# Patient Record
Sex: Male | Born: 1945 | Race: Black or African American | Hispanic: No | Smoking: Never smoker
Health system: Southern US, Community
[De-identification: ages and names within clinical notes are randomized; demographics above are authoritative.]

## PROBLEM LIST (undated history)

## (undated) DIAGNOSIS — Z992 Dependence on renal dialysis: Secondary | ICD-10-CM

## (undated) DIAGNOSIS — I10 Essential (primary) hypertension: Secondary | ICD-10-CM

## (undated) DIAGNOSIS — R7303 Prediabetes: Secondary | ICD-10-CM

## (undated) DIAGNOSIS — N186 End stage renal disease: Secondary | ICD-10-CM

## (undated) DIAGNOSIS — I119 Hypertensive heart disease without heart failure: Secondary | ICD-10-CM

## (undated) DIAGNOSIS — E291 Testicular hypofunction: Secondary | ICD-10-CM

## (undated) HISTORY — DX: Dependence on renal dialysis: Z99.2

## (undated) HISTORY — DX: Testicular hypofunction: E29.1

## (undated) HISTORY — PX: KIDNEY TRANSPLANT: SHX239

## (undated) HISTORY — DX: Essential (primary) hypertension: I10

## (undated) HISTORY — DX: Hypertensive heart disease without heart failure: I11.9

## (undated) HISTORY — DX: Prediabetes: R73.03

## (undated) HISTORY — DX: End stage renal disease: N18.6

---

## 2015-06-19 ENCOUNTER — Ambulatory Visit (INDEPENDENT_AMBULATORY_CARE_PROVIDER_SITE_OTHER): Payer: Medicare Other

## 2015-06-19 ENCOUNTER — Encounter: Payer: Self-pay | Admitting: Cardiovascular Disease

## 2015-06-19 ENCOUNTER — Ambulatory Visit (INDEPENDENT_AMBULATORY_CARE_PROVIDER_SITE_OTHER): Payer: Medicare Other | Admitting: Cardiovascular Disease

## 2015-06-19 VITALS — BP 100/76 | HR 57 | Ht 68.0 in | Wt 183.8 lb

## 2015-06-19 DIAGNOSIS — I4891 Unspecified atrial fibrillation: Secondary | ICD-10-CM | POA: Insufficient documentation

## 2015-06-19 DIAGNOSIS — I480 Paroxysmal atrial fibrillation: Secondary | ICD-10-CM | POA: Diagnosis not present

## 2015-06-19 DIAGNOSIS — I10 Essential (primary) hypertension: Secondary | ICD-10-CM

## 2015-06-19 DIAGNOSIS — N186 End stage renal disease: Secondary | ICD-10-CM | POA: Diagnosis not present

## 2015-06-19 LAB — POCT INR: INR: 1.9

## 2015-06-19 NOTE — Patient Instructions (Signed)

## 2015-06-19 NOTE — Progress Notes (Signed)
Cardiology Office Note   Date:  06/19/2015   ID:  Theodore Harrell, DOB 04-Jan-1946, MRN [id number]  PCP:  No PCP Per Patient  Cardiologist:   Vesta Mixer, MD   Chief Complaint  Patient presents with  . Anticoagulation   Problem List 1. Paroxysmal atrial fib 2. ESRD - on HD  Tu, Th, Sat  3.  HTN - has resolved with dialysis    History of Present Illness: Theodore Harrell is a 70 y.o. male who presents for further evaluation of paroxysmal atrial fib   Theodore Harrell has had end-stage renal disease for years. He is currently on the transplant list. As part of the transplant preoperative evaluation, he had a Lexiscan Myoview study. Apparently he developed paroxysmal atrial fibrillation while getting the [employer]. He was seen by a cardiologist at [facility]. He was started on Coumadin and was referred back to his primary care doctor. His primary care doctor referred him to [place] for further management of his atrial fibrillation and Coumadin /INR levels.  Denies any chest pain or dyspnea.  Works out 3 days a week at [employer]    Past Medical History  Diagnosis Date  . HTN (hypertension)   . Hypertensive heart disease   . End stage renal disease (HCC)   . 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 Prediabetes   . Hypogonadism in male     No past surgical history on file.   Current Outpatient Prescriptions  Medication Sig Dispense Refill  . aspirin EC 81 MG tablet Take 81 mg by mouth daily.    . fluticasone (FLONASE) 50 MCG/ACT nasal spray Place 1 spray into both nostrils daily.  0  . metoprolol succinate (TOPROL-XL) 25 MG 24 hr tablet Take 12.5 mg by mouth daily. TAKE 1/2 TABLET TWICE A DAY    . SENSIPAR 30 MG tablet Take 30 mg by mouth daily.    . sevelamer carbonate (RENVELA) 800 MG tablet Take 800 mg by mouth 3 (three) times daily with meals.    . warfarin (COUMADIN) 5 MG tablet Take 5 mg by mouth daily.     No current facility-administered medications for this visit.     Allergies:   Codeine and Penicillins    Social History:  The patient  reports that he has never smoked. He does not have any smokeless tobacco history on file. He reports that he does not drink alcohol or use illicit drugs.   Family History:  The patient's family history is not on file.    ROS:  Please see the history of present illness.    Review of Systems: Constitutional:  denies fever, chills, diaphoresis, appetite change and fatigue.  HEENT: denies photophobia, eye pain, redness, hearing loss, ear pain, congestion, sore throat, rhinorrhea, sneezing, neck pain, neck stiffness and tinnitus.  Respiratory: denies SOB, DOE, cough, chest tightness, and wheezing.  Cardiovascular: denies chest pain, palpitations and leg swelling.  Gastrointestinal: denies nausea, vomiting, abdominal pain, diarrhea, constipation, blood in stool.  Genitourinary: denies dysuria, urgency, frequency, hematuria, flank pain and difficulty urinating.  Musculoskeletal: denies  myalgias, back pain, joint swelling, arthralgias and gait problem.   Skin: denies pallor, rash and wound.  Neurological: denies dizziness, seizures, syncope, weakness, light-headedness, numbness and headaches.   Hematological: denies adenopathy, easy bruising, personal or family bleeding history.  Psychiatric/ Behavioral: denies suicidal ideation, mood changes, confusion, nervousness, sleep disturbance and agitation.       All other systems are reviewed and negative.  PHYSICAL EXAM: VS:  BP 100/76 mmHg  Pulse 57  Ht  (1.727 m)  Wt 183 lb 12.8 oz (83.371 kg)  BMI 27.95 kg/m2 , BMI Body mass index is 27.95 kg/(m^2). GEN: Well nourished, well developed, in no acute distress HEENT: normal Neck: no JVD, carotid bruits, or masses Cardiac: RRR; no murmurs, rubs, or gallops,no edema  Respiratory:  clear to auscultation bilaterally, normal work of breathing GI: soft, nontender, nondistended, + BS MS: no deformity or  atrophy Skin: warm and dry, no rash Neuro:  Strength and sensation are intact Psych: normal   EKG:  EKG is ordered today. The ekg ordered today demonstrates sinus brady at 57.   Possible LAE.    Recent Labs: No results found for requested labs within last 365 days.    Lipid Panel No results found for: CHOL, TRIG, HDL, CHOLHDL, VLDL, LDLCALC, LDLDIRECT    Wt Readings from Last 3 Encounters:  06/19/15 183 lb 12.8 oz (83.371 kg)      Other studies Reviewed: Additional studies/ records that were reviewed today include: . Review of the above records demonstrates:    ASSESSMENT AND PLAN:  1.  Paroxysmal atrial Fib:  CHADS2VASC = 2 ( age, HTN) ,   He is on coumadin .   Will check INR today .  He is in NSR today   his rate is well-controlled. Continue current dose of metoprolol.  Current medicines are reviewed at length with the patient today.  The patient does not have concerns regarding medicines.  The following changes have been made:  no change  Labs/ tests ordered today include:  No orders of the defined types were placed in this encounter.     Disposition:   FU with me in 1 year.       Les Longmore, Deloris Ping, MD  06/19/2015 2:55 PM    [facility] [address] [place], [place], [place]  [address] Phone: [phone]; Fax: [phone]   [facility]  [address] [place], [place]  [address] [phone]   Fax [phone]

## 2015-06-19 NOTE — Patient Instructions (Addendum)
Medication Instructions:  Your physician recommends that you continue on your current medications as directed. Please refer to the Current Medication list given to you today.   Labwork: TODAY - appointment with [facility] for pt/inr and management   Testing/Procedures: None Ordered   Follow-Up: Management of Coumadin with our Coumadin Clinic  Your physician wants you to follow-up in: 1 year with Dr. Elease Hashimoto.  You will receive a reminder letter in the mail two months in advance. If you don't receive a letter, please call our office to schedule the follow-up appointment.   If you need a refill on your cardiac medications before your next appointment, please call your pharmacy.   Thank you for choosing [facility]! Eligha Bridegroom, RN [phone]

## 2015-07-02 ENCOUNTER — Ambulatory Visit (INDEPENDENT_AMBULATORY_CARE_PROVIDER_SITE_OTHER): Payer: Medicare Other | Admitting: Pharmacist

## 2015-07-02 DIAGNOSIS — I480 Paroxysmal atrial fibrillation: Secondary | ICD-10-CM

## 2015-07-02 LAB — POCT INR: INR: 3

## 2015-07-16 ENCOUNTER — Ambulatory Visit (INDEPENDENT_AMBULATORY_CARE_PROVIDER_SITE_OTHER): Payer: Medicare Other

## 2015-07-16 DIAGNOSIS — I480 Paroxysmal atrial fibrillation: Secondary | ICD-10-CM | POA: Diagnosis not present

## 2015-07-16 LAB — POCT INR: INR: 2.3

## 2015-07-16 MED ORDER — WARFARIN SODIUM 5 MG PO TABS: ORAL_TABLET | ORAL | Status: DC

## 2015-08-06 ENCOUNTER — Ambulatory Visit (INDEPENDENT_AMBULATORY_CARE_PROVIDER_SITE_OTHER): Payer: Medicare Other | Admitting: *Deleted

## 2015-08-06 DIAGNOSIS — I480 Paroxysmal atrial fibrillation: Secondary | ICD-10-CM

## 2015-08-06 LAB — POCT INR: INR: 2

## 2015-10-09 ENCOUNTER — Other Ambulatory Visit: Payer: Self-pay | Admitting: Cardiovascular Disease

## 2015-10-31 ENCOUNTER — Encounter: Payer: Self-pay | Admitting: *Deleted

## 2015-10-31 ENCOUNTER — Ambulatory Visit: Payer: Self-pay | Admitting: Cardiovascular Disease

## 2015-10-31 DIAGNOSIS — I480 Paroxysmal atrial fibrillation: Secondary | ICD-10-CM

## 2015-10-31 NOTE — Telephone Encounter (Signed)
This encounter was created in error - please disregard.

## 2015-11-15 ENCOUNTER — Ambulatory Visit (INDEPENDENT_AMBULATORY_CARE_PROVIDER_SITE_OTHER): Payer: Medicare Other | Admitting: *Deleted

## 2015-11-15 DIAGNOSIS — I480 Paroxysmal atrial fibrillation: Secondary | ICD-10-CM

## 2015-11-15 LAB — POCT INR: INR: 2.2

## 2015-11-22 ENCOUNTER — Ambulatory Visit (INDEPENDENT_AMBULATORY_CARE_PROVIDER_SITE_OTHER): Payer: Medicare Other | Admitting: *Deleted

## 2015-11-22 ENCOUNTER — Encounter (INDEPENDENT_AMBULATORY_CARE_PROVIDER_SITE_OTHER): Payer: Self-pay

## 2015-11-22 DIAGNOSIS — I480 Paroxysmal atrial fibrillation: Secondary | ICD-10-CM

## 2015-11-22 LAB — POCT INR: INR: 2.7

## 2015-12-04 ENCOUNTER — Ambulatory Visit (INDEPENDENT_AMBULATORY_CARE_PROVIDER_SITE_OTHER): Payer: Medicare Other | Admitting: *Deleted

## 2015-12-04 DIAGNOSIS — I480 Paroxysmal atrial fibrillation: Secondary | ICD-10-CM | POA: Diagnosis not present

## 2015-12-04 LAB — POCT INR: INR: 2.5

## 2015-12-11 ENCOUNTER — Ambulatory Visit (INDEPENDENT_AMBULATORY_CARE_PROVIDER_SITE_OTHER): Payer: Medicare Other | Admitting: *Deleted

## 2015-12-11 DIAGNOSIS — I480 Paroxysmal atrial fibrillation: Secondary | ICD-10-CM

## 2015-12-11 LAB — POCT INR: INR: 3.2

## 2015-12-18 ENCOUNTER — Ambulatory Visit (INDEPENDENT_AMBULATORY_CARE_PROVIDER_SITE_OTHER): Payer: Medicare Other | Admitting: *Deleted

## 2015-12-18 DIAGNOSIS — I480 Paroxysmal atrial fibrillation: Secondary | ICD-10-CM | POA: Diagnosis not present

## 2015-12-18 LAB — POCT INR: INR: 3.2

## 2015-12-28 ENCOUNTER — Ambulatory Visit (INDEPENDENT_AMBULATORY_CARE_PROVIDER_SITE_OTHER): Payer: Medicare Other | Admitting: *Deleted

## 2015-12-28 ENCOUNTER — Encounter (INDEPENDENT_AMBULATORY_CARE_PROVIDER_SITE_OTHER): Payer: Self-pay

## 2015-12-28 DIAGNOSIS — I480 Paroxysmal atrial fibrillation: Secondary | ICD-10-CM | POA: Diagnosis not present

## 2015-12-28 LAB — POCT INR: INR: 2.7

## 2016-01-11 ENCOUNTER — Ambulatory Visit (INDEPENDENT_AMBULATORY_CARE_PROVIDER_SITE_OTHER): Payer: Medicare Other | Admitting: *Deleted

## 2016-01-11 DIAGNOSIS — I480 Paroxysmal atrial fibrillation: Secondary | ICD-10-CM

## 2016-01-11 LAB — POCT INR: INR: 3.1

## 2016-01-31 ENCOUNTER — Ambulatory Visit (INDEPENDENT_AMBULATORY_CARE_PROVIDER_SITE_OTHER): Payer: Medicare Other | Admitting: *Deleted

## 2016-01-31 ENCOUNTER — Encounter (INDEPENDENT_AMBULATORY_CARE_PROVIDER_SITE_OTHER): Payer: Self-pay

## 2016-01-31 DIAGNOSIS — I480 Paroxysmal atrial fibrillation: Secondary | ICD-10-CM | POA: Diagnosis not present

## 2016-01-31 LAB — POCT INR: INR: 3.6

## 2016-02-14 ENCOUNTER — Ambulatory Visit (INDEPENDENT_AMBULATORY_CARE_PROVIDER_SITE_OTHER): Payer: Medicare Other | Admitting: *Deleted

## 2016-02-14 DIAGNOSIS — I480 Paroxysmal atrial fibrillation: Secondary | ICD-10-CM | POA: Diagnosis not present

## 2016-02-14 LAB — POCT INR: INR: 2.5

## 2016-03-06 ENCOUNTER — Ambulatory Visit (INDEPENDENT_AMBULATORY_CARE_PROVIDER_SITE_OTHER): Payer: Medicare Other | Admitting: *Deleted

## 2016-03-06 DIAGNOSIS — I480 Paroxysmal atrial fibrillation: Secondary | ICD-10-CM | POA: Diagnosis not present

## 2016-03-06 LAB — POCT INR: INR: 3

## 2016-03-12 ENCOUNTER — Other Ambulatory Visit: Payer: Self-pay | Admitting: Pharmacist

## 2016-03-12 ENCOUNTER — Telehealth: Payer: Self-pay | Admitting: Pharmacist

## 2016-03-12 NOTE — Telephone Encounter (Signed)
Patient called to report medication changes. States that his PCP stopped his ASA 81mg , increased his Crestor to 20mg  daily, and added sildenafil 20mg  - taking 5 tabs prn.  Updated patient's medication list and advised him that none of these medication changes will interact with his warfarin.

## 2016-04-03 ENCOUNTER — Ambulatory Visit (INDEPENDENT_AMBULATORY_CARE_PROVIDER_SITE_OTHER): Payer: Medicare Other | Admitting: *Deleted

## 2016-04-03 DIAGNOSIS — I480 Paroxysmal atrial fibrillation: Secondary | ICD-10-CM | POA: Diagnosis not present

## 2016-04-03 LAB — POCT INR: INR: 4.5

## 2016-04-14 ENCOUNTER — Ambulatory Visit (INDEPENDENT_AMBULATORY_CARE_PROVIDER_SITE_OTHER): Payer: Medicare Other | Admitting: *Deleted

## 2016-04-14 DIAGNOSIS — I480 Paroxysmal atrial fibrillation: Secondary | ICD-10-CM | POA: Diagnosis not present

## 2016-04-14 LAB — POCT INR: INR: 3.7

## 2016-04-28 ENCOUNTER — Ambulatory Visit (INDEPENDENT_AMBULATORY_CARE_PROVIDER_SITE_OTHER): Payer: Medicare Other | Admitting: *Deleted

## 2016-04-28 DIAGNOSIS — I480 Paroxysmal atrial fibrillation: Secondary | ICD-10-CM

## 2016-04-28 LAB — POCT INR: INR: 2.5

## 2016-05-14 ENCOUNTER — Ambulatory Visit (INDEPENDENT_AMBULATORY_CARE_PROVIDER_SITE_OTHER): Payer: Medicare Other | Admitting: *Deleted

## 2016-05-14 DIAGNOSIS — I480 Paroxysmal atrial fibrillation: Secondary | ICD-10-CM | POA: Diagnosis not present

## 2016-05-14 LAB — POCT INR: INR: 2.1

## 2016-06-03 ENCOUNTER — Ambulatory Visit (INDEPENDENT_AMBULATORY_CARE_PROVIDER_SITE_OTHER): Payer: Medicare Other | Admitting: *Deleted

## 2016-06-03 DIAGNOSIS — I480 Paroxysmal atrial fibrillation: Secondary | ICD-10-CM

## 2016-06-03 LAB — POCT INR: INR: 2.3

## 2016-07-02 ENCOUNTER — Ambulatory Visit (INDEPENDENT_AMBULATORY_CARE_PROVIDER_SITE_OTHER): Payer: Medicare Other | Admitting: *Deleted

## 2016-07-02 DIAGNOSIS — I480 Paroxysmal atrial fibrillation: Secondary | ICD-10-CM | POA: Diagnosis not present

## 2016-07-02 LAB — POCT INR: INR: 2.3

## 2016-07-30 ENCOUNTER — Ambulatory Visit (INDEPENDENT_AMBULATORY_CARE_PROVIDER_SITE_OTHER): Payer: Medicare Other | Admitting: Pharmacist

## 2016-07-30 DIAGNOSIS — I480 Paroxysmal atrial fibrillation: Secondary | ICD-10-CM

## 2016-07-30 LAB — POCT INR: INR: 2.4

## 2016-09-10 ENCOUNTER — Ambulatory Visit (INDEPENDENT_AMBULATORY_CARE_PROVIDER_SITE_OTHER): Payer: Medicare Other | Admitting: *Deleted

## 2016-09-10 DIAGNOSIS — I480 Paroxysmal atrial fibrillation: Secondary | ICD-10-CM | POA: Diagnosis not present

## 2016-09-10 LAB — POCT INR: INR: 2.4

## 2016-10-22 ENCOUNTER — Ambulatory Visit (INDEPENDENT_AMBULATORY_CARE_PROVIDER_SITE_OTHER): Payer: Medicare Other

## 2016-10-22 DIAGNOSIS — I4891 Unspecified atrial fibrillation: Secondary | ICD-10-CM | POA: Diagnosis not present

## 2016-10-22 DIAGNOSIS — I480 Paroxysmal atrial fibrillation: Secondary | ICD-10-CM

## 2016-10-22 LAB — POCT INR: INR: 2.1

## 2016-12-03 ENCOUNTER — Ambulatory Visit (INDEPENDENT_AMBULATORY_CARE_PROVIDER_SITE_OTHER): Payer: Medicare Other | Admitting: *Deleted

## 2016-12-03 DIAGNOSIS — I480 Paroxysmal atrial fibrillation: Secondary | ICD-10-CM | POA: Diagnosis not present

## 2016-12-03 DIAGNOSIS — I4891 Unspecified atrial fibrillation: Secondary | ICD-10-CM

## 2016-12-03 LAB — POCT INR: INR: 2.7

## 2017-01-14 ENCOUNTER — Ambulatory Visit (INDEPENDENT_AMBULATORY_CARE_PROVIDER_SITE_OTHER): Payer: Medicare Other | Admitting: *Deleted

## 2017-01-14 DIAGNOSIS — I480 Paroxysmal atrial fibrillation: Secondary | ICD-10-CM | POA: Diagnosis not present

## 2017-01-14 DIAGNOSIS — I4891 Unspecified atrial fibrillation: Secondary | ICD-10-CM | POA: Diagnosis not present

## 2017-01-14 LAB — POCT INR: INR: 3.5

## 2017-01-29 ENCOUNTER — Ambulatory Visit (INDEPENDENT_AMBULATORY_CARE_PROVIDER_SITE_OTHER): Payer: Medicare Other | Admitting: *Deleted

## 2017-01-29 DIAGNOSIS — Z5181 Encounter for therapeutic drug level monitoring: Secondary | ICD-10-CM

## 2017-01-29 DIAGNOSIS — I4891 Unspecified atrial fibrillation: Secondary | ICD-10-CM

## 2017-01-29 DIAGNOSIS — I480 Paroxysmal atrial fibrillation: Secondary | ICD-10-CM | POA: Diagnosis not present

## 2017-01-29 LAB — POCT INR: INR: 3

## 2017-02-25 ENCOUNTER — Ambulatory Visit (INDEPENDENT_AMBULATORY_CARE_PROVIDER_SITE_OTHER): Payer: Medicare Other

## 2017-02-25 DIAGNOSIS — I480 Paroxysmal atrial fibrillation: Secondary | ICD-10-CM

## 2017-02-25 DIAGNOSIS — Z5181 Encounter for therapeutic drug level monitoring: Secondary | ICD-10-CM

## 2017-02-25 DIAGNOSIS — I4891 Unspecified atrial fibrillation: Secondary | ICD-10-CM

## 2017-02-25 LAB — POCT INR: INR: 2.3

## 2017-03-25 ENCOUNTER — Encounter (INDEPENDENT_AMBULATORY_CARE_PROVIDER_SITE_OTHER): Payer: Self-pay

## 2017-03-25 ENCOUNTER — Ambulatory Visit (INDEPENDENT_AMBULATORY_CARE_PROVIDER_SITE_OTHER): Payer: Medicare Other | Admitting: *Deleted

## 2017-03-25 DIAGNOSIS — I480 Paroxysmal atrial fibrillation: Secondary | ICD-10-CM | POA: Diagnosis not present

## 2017-03-25 DIAGNOSIS — Z5181 Encounter for therapeutic drug level monitoring: Secondary | ICD-10-CM | POA: Diagnosis not present

## 2017-03-25 LAB — POCT INR: INR: 2.8

## 2017-04-22 ENCOUNTER — Ambulatory Visit (INDEPENDENT_AMBULATORY_CARE_PROVIDER_SITE_OTHER): Payer: Medicare Other | Admitting: *Deleted

## 2017-04-22 DIAGNOSIS — Z5181 Encounter for therapeutic drug level monitoring: Secondary | ICD-10-CM

## 2017-04-22 DIAGNOSIS — I480 Paroxysmal atrial fibrillation: Secondary | ICD-10-CM | POA: Diagnosis not present

## 2017-04-22 LAB — POCT INR: INR: 3

## 2017-04-22 NOTE — Patient Instructions (Signed)
Continue on same dosage 1/2 tablet everyday except 1 tablet on Mondays and Fridays.  Call us with any changes or questions, bleeding issues-Coumadin Clinic # [phone], Main # [phone]. Recheck INR in 6 weeks.

## 2017-06-03 ENCOUNTER — Ambulatory Visit (INDEPENDENT_AMBULATORY_CARE_PROVIDER_SITE_OTHER): Payer: Medicare Other | Admitting: *Deleted

## 2017-06-03 DIAGNOSIS — I480 Paroxysmal atrial fibrillation: Secondary | ICD-10-CM | POA: Diagnosis not present

## 2017-06-03 DIAGNOSIS — Z5181 Encounter for therapeutic drug level monitoring: Secondary | ICD-10-CM

## 2017-06-03 LAB — POCT INR: INR: 3.6

## 2017-06-03 NOTE — Patient Instructions (Signed)
Description   Skip today's dose, then Continue on same dosage 1/2 tablet everyday except 1 tablet on Mondays and Fridays.  Call us with any changes or questions, bleeding issues-C[facility]madin Clinic # [phone], Main # [phone]. Recheck INR in 2 weeks.

## 2017-06-17 ENCOUNTER — Ambulatory Visit (INDEPENDENT_AMBULATORY_CARE_PROVIDER_SITE_OTHER): Payer: Medicare Other

## 2017-06-17 DIAGNOSIS — Z5181 Encounter for therapeutic drug level monitoring: Secondary | ICD-10-CM | POA: Diagnosis not present

## 2017-06-17 DIAGNOSIS — I480 Paroxysmal atrial fibrillation: Secondary | ICD-10-CM

## 2017-06-17 LAB — POCT INR: INR: 3.3

## 2017-06-17 NOTE — Patient Instructions (Signed)
Description   Skip today's dosage of Coumadin, then start taking 1/2 tablet everyday except 1 tablet on Mondays.  Call us with any changes or questions, bleeding issues-[facility] Clinic # [phone], Main # [phone]. Recheck INR in 2 weeks.

## 2017-07-01 ENCOUNTER — Ambulatory Visit (INDEPENDENT_AMBULATORY_CARE_PROVIDER_SITE_OTHER): Payer: Medicare Other | Admitting: *Deleted

## 2017-07-01 DIAGNOSIS — I480 Paroxysmal atrial fibrillation: Secondary | ICD-10-CM | POA: Diagnosis not present

## 2017-07-01 DIAGNOSIS — Z5181 Encounter for therapeutic drug level monitoring: Secondary | ICD-10-CM

## 2017-07-01 LAB — POCT INR: INR: 1.8

## 2017-07-01 NOTE — Patient Instructions (Signed)
Description   Today Feb 13th take 1 tablet (5mg ) then continue  taking 1/2 tablet everyday except 1 tablet on Mondays.  Call us with any changes or questions, bleeding issues-[facility]madin Clinic # [phone], Main # [phone]. Recheck INR in 2 weeks.

## 2017-07-16 ENCOUNTER — Ambulatory Visit (INDEPENDENT_AMBULATORY_CARE_PROVIDER_SITE_OTHER): Payer: Medicare Other | Admitting: *Deleted

## 2017-07-16 DIAGNOSIS — I480 Paroxysmal atrial fibrillation: Secondary | ICD-10-CM | POA: Diagnosis not present

## 2017-07-16 DIAGNOSIS — Z5181 Encounter for therapeutic drug level monitoring: Secondary | ICD-10-CM

## 2017-07-16 DIAGNOSIS — I4891 Unspecified atrial fibrillation: Secondary | ICD-10-CM | POA: Diagnosis not present

## 2017-07-16 LAB — POCT INR: INR: 2.2

## 2017-07-16 NOTE — Patient Instructions (Signed)
Description   Continue taking 1/2 tablet everyday except 1 tablet on Mondays.  Call us with any changes or questions, bleeding issues-[facility]madin Clinic # [phone], Main # [phone]. Recheck INR in 3 weeks.

## 2017-08-05 ENCOUNTER — Encounter (INDEPENDENT_AMBULATORY_CARE_PROVIDER_SITE_OTHER): Payer: Self-pay

## 2017-08-05 ENCOUNTER — Ambulatory Visit (INDEPENDENT_AMBULATORY_CARE_PROVIDER_SITE_OTHER): Payer: Medicare Other | Admitting: *Deleted

## 2017-08-05 DIAGNOSIS — Z5181 Encounter for therapeutic drug level monitoring: Secondary | ICD-10-CM

## 2017-08-05 DIAGNOSIS — I480 Paroxysmal atrial fibrillation: Secondary | ICD-10-CM

## 2017-08-05 LAB — POCT INR: INR: 2.4

## 2017-08-05 NOTE — Patient Instructions (Signed)
Description   Continue taking 1/2 tablet everyday except 1 tablet on Mondays.  Call us with any changes or questions, bleeding issues-[facility] Clinic # [phone], Main # [phone]. Recheck INR in 4 weeks.

## 2017-09-02 ENCOUNTER — Ambulatory Visit (INDEPENDENT_AMBULATORY_CARE_PROVIDER_SITE_OTHER): Payer: Medicare Other | Admitting: Pharmacist

## 2017-09-02 DIAGNOSIS — I480 Paroxysmal atrial fibrillation: Secondary | ICD-10-CM

## 2017-09-02 DIAGNOSIS — Z5181 Encounter for therapeutic drug level monitoring: Secondary | ICD-10-CM | POA: Diagnosis not present

## 2017-09-02 LAB — POCT INR: INR: 1.9

## 2017-09-02 NOTE — Patient Instructions (Signed)
Description   Take 1 tablet today then Continue taking 1/2 tablet everyday except 1 tablet on Mondays.  Call us with any changes or questions, bleeding issues-[facility] # [phone], Main # [phone]. Recheck INR in 3 weeks.

## 2017-09-23 ENCOUNTER — Ambulatory Visit (INDEPENDENT_AMBULATORY_CARE_PROVIDER_SITE_OTHER): Payer: Medicare Other | Admitting: *Deleted

## 2017-09-23 DIAGNOSIS — I480 Paroxysmal atrial fibrillation: Secondary | ICD-10-CM

## 2017-09-23 DIAGNOSIS — Z5181 Encounter for therapeutic drug level monitoring: Secondary | ICD-10-CM

## 2017-09-23 LAB — POCT INR: INR: 2

## 2017-09-23 NOTE — Patient Instructions (Signed)
Description    Continue taking 1/2 tablet everyday except 1 tablet on Mondays.  Call us with any changes or questions, bleeding issues-[facility]in [facility] # [phone], Main # [phone]. Recheck INR in 4 weeks.

## 2017-10-21 ENCOUNTER — Ambulatory Visit (INDEPENDENT_AMBULATORY_CARE_PROVIDER_SITE_OTHER): Payer: Medicare Other | Admitting: *Deleted

## 2017-10-21 DIAGNOSIS — I480 Paroxysmal atrial fibrillation: Secondary | ICD-10-CM | POA: Diagnosis not present

## 2017-10-21 DIAGNOSIS — Z5181 Encounter for therapeutic drug level monitoring: Secondary | ICD-10-CM | POA: Diagnosis not present

## 2017-10-21 LAB — POCT INR: INR: 1.9 — AB (ref 2.0–3.0)

## 2017-10-21 NOTE — Patient Instructions (Signed)
Description   Today take 1 tablet, then change your dose to 1/2 tablet everyday except 1 tablet on Mondays and Fridays.  Call us with any changes or questions, bleeding issues-Coumadin Clinic # [phone], Main # [phone]. Recheck INR in 2 weeks.

## 2017-11-04 ENCOUNTER — Ambulatory Visit (INDEPENDENT_AMBULATORY_CARE_PROVIDER_SITE_OTHER): Payer: Medicare Other | Admitting: Pharmacist

## 2017-11-04 DIAGNOSIS — I480 Paroxysmal atrial fibrillation: Secondary | ICD-10-CM | POA: Diagnosis not present

## 2017-11-04 DIAGNOSIS — Z5181 Encounter for therapeutic drug level monitoring: Secondary | ICD-10-CM

## 2017-11-04 LAB — POCT INR: INR: 3 (ref 2.0–3.0)

## 2017-11-04 NOTE — Patient Instructions (Signed)
Description   Continue taking 1/2 tablet everyday except 1 tablet on Mondays and Fridays.  Call us with any changes or questions, bleeding issues-[facility]in Clinic # [phone], Main # [phone]. Recheck INR in 4 weeks.

## 2017-12-02 ENCOUNTER — Encounter (INDEPENDENT_AMBULATORY_CARE_PROVIDER_SITE_OTHER): Payer: Self-pay

## 2017-12-02 ENCOUNTER — Ambulatory Visit (INDEPENDENT_AMBULATORY_CARE_PROVIDER_SITE_OTHER): Payer: Medicare Other | Admitting: *Deleted

## 2017-12-02 DIAGNOSIS — I480 Paroxysmal atrial fibrillation: Secondary | ICD-10-CM

## 2017-12-02 DIAGNOSIS — Z5181 Encounter for therapeutic drug level monitoring: Secondary | ICD-10-CM | POA: Diagnosis not present

## 2017-12-02 LAB — POCT INR: INR: 2.6 (ref 2.0–3.0)

## 2017-12-02 NOTE — Patient Instructions (Signed)
Description   Continue taking 1/2 tablet everyday except 1 tablet on Mondays and Fridays.  Call us with any changes or questions, bleeding issues-[facility]in [facility] # [phone], Main # [phone]. Recheck INR in 4 weeks.

## 2017-12-30 ENCOUNTER — Ambulatory Visit (INDEPENDENT_AMBULATORY_CARE_PROVIDER_SITE_OTHER): Payer: Medicare Other | Admitting: *Deleted

## 2017-12-30 DIAGNOSIS — I480 Paroxysmal atrial fibrillation: Secondary | ICD-10-CM

## 2017-12-30 DIAGNOSIS — Z5181 Encounter for therapeutic drug level monitoring: Secondary | ICD-10-CM

## 2017-12-30 LAB — POCT INR: INR: 2.7 (ref 2.0–3.0)

## 2017-12-30 NOTE — Patient Instructions (Signed)
Description   Continue taking 1/2 tablet everyday except 1 tablet on Mondays and Fridays.  Call us with any changes or questions, bleeding issues-C[facility]madin Clinic # [phone], Main # [phone]. Recheck INR in 5 weeks.

## 2018-02-03 ENCOUNTER — Ambulatory Visit (INDEPENDENT_AMBULATORY_CARE_PROVIDER_SITE_OTHER): Payer: Medicare Other | Admitting: *Deleted

## 2018-02-03 DIAGNOSIS — I480 Paroxysmal atrial fibrillation: Secondary | ICD-10-CM | POA: Diagnosis not present

## 2018-02-03 DIAGNOSIS — Z5181 Encounter for therapeutic drug level monitoring: Secondary | ICD-10-CM | POA: Diagnosis not present

## 2018-02-03 LAB — POCT INR: INR: 2.7 (ref 2.0–3.0)

## 2018-02-03 NOTE — Patient Instructions (Signed)
Description   Since you are starting Prednisone taper and Colchicine take 1/2 tablet of Coumadin until appt on Monday.   Normal dose: Continue taking 1/2 tablet everyday except 1 tablet on Mondays and Fridays.  Call us with any changes or questions, bleeding issues-C[facility]madin Clinic # [phone], Main # [phone]. Recheck INR in 5 weeks.

## 2018-02-08 ENCOUNTER — Ambulatory Visit (INDEPENDENT_AMBULATORY_CARE_PROVIDER_SITE_OTHER): Payer: Medicare Other

## 2018-02-08 DIAGNOSIS — Z5181 Encounter for therapeutic drug level monitoring: Secondary | ICD-10-CM

## 2018-02-08 DIAGNOSIS — I480 Paroxysmal atrial fibrillation: Secondary | ICD-10-CM

## 2018-02-08 LAB — POCT INR: INR: 2.8 (ref 2.0–3.0)

## 2018-02-08 NOTE — Patient Instructions (Signed)
Description   Continue taking 1/2 tablet everyday except 1 tablet on Mondays and Fridays.  Call us with any changes or questions, bleeding issues-C[facility]madin Clinic # [phone], Main # [phone]. Recheck INR in 6 weeks.

## 2018-03-24 ENCOUNTER — Ambulatory Visit (INDEPENDENT_AMBULATORY_CARE_PROVIDER_SITE_OTHER): Payer: Medicare Other | Admitting: *Deleted

## 2018-03-24 DIAGNOSIS — I480 Paroxysmal atrial fibrillation: Secondary | ICD-10-CM | POA: Diagnosis not present

## 2018-03-24 DIAGNOSIS — Z5181 Encounter for therapeutic drug level monitoring: Secondary | ICD-10-CM

## 2018-03-24 LAB — POCT INR: INR: 2.9 (ref 2.0–3.0)

## 2018-03-24 NOTE — Patient Instructions (Signed)
Description   Continue taking 1/2 tablet everyday except 1 tablet on Mondays and Fridays.  Call us with any changes or questions, bleeding issues-[facility]in Clinic # [phone], Main # [phone]. Recheck INR in 6 weeks.     

## 2018-05-05 ENCOUNTER — Ambulatory Visit (INDEPENDENT_AMBULATORY_CARE_PROVIDER_SITE_OTHER): Payer: Medicare Other | Admitting: *Deleted

## 2018-05-05 DIAGNOSIS — Z5181 Encounter for therapeutic drug level monitoring: Secondary | ICD-10-CM | POA: Diagnosis not present

## 2018-05-05 DIAGNOSIS — I480 Paroxysmal atrial fibrillation: Secondary | ICD-10-CM

## 2018-05-05 LAB — POCT INR: INR: 2.9 (ref 2.0–3.0)

## 2018-05-05 NOTE — Patient Instructions (Signed)
Description   Continue taking 1/2 tablet everyday except 1 tablet on Mondays and Fridays.  Call us with any changes or questions, bleeding issues-[facility]in Clinic # [phone], Main # [phone]. Recheck INR in 6 weeks.     

## 2018-06-16 ENCOUNTER — Ambulatory Visit (INDEPENDENT_AMBULATORY_CARE_PROVIDER_SITE_OTHER): Payer: Medicare Other | Admitting: *Deleted

## 2018-06-16 DIAGNOSIS — I480 Paroxysmal atrial fibrillation: Secondary | ICD-10-CM | POA: Diagnosis not present

## 2018-06-16 DIAGNOSIS — Z5181 Encounter for therapeutic drug level monitoring: Secondary | ICD-10-CM

## 2018-06-16 LAB — POCT INR: INR: 2 (ref 2.0–3.0)

## 2018-06-16 NOTE — Patient Instructions (Signed)
Description   Today take 1 tablet then continue taking 1/2 tablet everyday except 1 tablet on Mondays and Fridays.  Call us with any changes or questions, bleeding issues-C[facility]in Clinic # [phone], Main # [phone]. Recheck INR in 6 weeks.

## 2018-07-28 ENCOUNTER — Ambulatory Visit (INDEPENDENT_AMBULATORY_CARE_PROVIDER_SITE_OTHER): Payer: Medicare Other | Admitting: Pharmacist

## 2018-07-28 ENCOUNTER — Other Ambulatory Visit: Payer: Self-pay

## 2018-07-28 DIAGNOSIS — I480 Paroxysmal atrial fibrillation: Secondary | ICD-10-CM | POA: Diagnosis not present

## 2018-07-28 LAB — POCT INR: INR: 1.8 — AB (ref 2.0–3.0)

## 2018-07-28 NOTE — Patient Instructions (Signed)
Description   Please take 1 tablet today, then continue taking 1/2 tablet everyday except 1 tablet on Mondays and Fridays.  Call us with any changes or questions, bleeding issues-C[facility]madin Clinic # [phone], Main # [phone]. Recheck INR in 3 weeks.

## 2018-08-04 ENCOUNTER — Other Ambulatory Visit: Payer: Self-pay | Admitting: Student

## 2018-08-04 ENCOUNTER — Other Ambulatory Visit: Payer: Self-pay | Admitting: Hematology & Oncology

## 2018-08-04 DIAGNOSIS — R319 Hematuria, unspecified: Secondary | ICD-10-CM

## 2018-08-06 ENCOUNTER — Other Ambulatory Visit: Payer: Self-pay

## 2018-08-06 ENCOUNTER — Inpatient Hospital Stay: Admission: RE | Admit: 2018-08-06 | Payer: Medicare Other | Source: Ambulatory Visit

## 2018-08-06 ENCOUNTER — Ambulatory Visit
Admission: RE | Admit: 2018-08-06 | Discharge: 2018-08-06 | Disposition: A | Discharge status: Home / Self Care | Payer: Medicare Other | Source: Ambulatory Visit | Attending: Student | Admitting: Student

## 2018-08-06 DIAGNOSIS — R319 Hematuria, unspecified: Secondary | ICD-10-CM

## 2018-08-17 ENCOUNTER — Telehealth: Payer: Self-pay | Admitting: Pharmacist

## 2018-08-17 NOTE — Telephone Encounter (Signed)

## 2018-08-18 ENCOUNTER — Other Ambulatory Visit: Payer: Self-pay

## 2018-08-18 ENCOUNTER — Ambulatory Visit (INDEPENDENT_AMBULATORY_CARE_PROVIDER_SITE_OTHER): Payer: Medicare Other | Admitting: Pharmacist

## 2018-08-18 DIAGNOSIS — I480 Paroxysmal atrial fibrillation: Secondary | ICD-10-CM | POA: Diagnosis not present

## 2018-08-18 DIAGNOSIS — Z5181 Encounter for therapeutic drug level monitoring: Secondary | ICD-10-CM | POA: Diagnosis not present

## 2018-08-18 LAB — POCT INR: INR: 3 (ref 2.0–3.0)

## 2018-09-20 ENCOUNTER — Telehealth: Payer: Self-pay

## 2018-09-20 NOTE — Telephone Encounter (Signed)

## 2018-09-22 ENCOUNTER — Ambulatory Visit (INDEPENDENT_AMBULATORY_CARE_PROVIDER_SITE_OTHER): Payer: Medicare Other | Admitting: *Deleted

## 2018-09-22 ENCOUNTER — Other Ambulatory Visit: Payer: Self-pay

## 2018-09-22 DIAGNOSIS — I4891 Unspecified atrial fibrillation: Secondary | ICD-10-CM

## 2018-09-22 DIAGNOSIS — I480 Paroxysmal atrial fibrillation: Secondary | ICD-10-CM | POA: Diagnosis not present

## 2018-09-22 DIAGNOSIS — Z5181 Encounter for therapeutic drug level monitoring: Secondary | ICD-10-CM

## 2018-09-22 LAB — POCT INR: INR: 3.1 — AB (ref 2.0–3.0)

## 2018-09-22 NOTE — Patient Instructions (Signed)
Spoke with pt and instructed him to hold tonight's dose of coumadin and continue taking 1/2 tablet everyday except 1 tablet on Mondays and Fridays.  Call us with any changes or questions, bleeding issues-Coumadin Clinic # [phone], Main # [phone]. Recheck INR in 4 weeks.

## 2018-10-18 ENCOUNTER — Telehealth: Payer: Self-pay

## 2018-10-18 NOTE — Telephone Encounter (Signed)
lmom for prescreen  

## 2018-10-18 NOTE — Telephone Encounter (Signed)

## 2018-10-19 ENCOUNTER — Ambulatory Visit (INDEPENDENT_AMBULATORY_CARE_PROVIDER_SITE_OTHER): Payer: Medicare Other

## 2018-10-19 ENCOUNTER — Other Ambulatory Visit: Payer: Self-pay

## 2018-10-19 DIAGNOSIS — Z5181 Encounter for therapeutic drug level monitoring: Secondary | ICD-10-CM | POA: Diagnosis not present

## 2018-10-19 DIAGNOSIS — I480 Paroxysmal atrial fibrillation: Secondary | ICD-10-CM | POA: Diagnosis not present

## 2018-10-19 LAB — POCT INR: INR: 2.7 (ref 2.0–3.0)

## 2018-10-19 NOTE — Patient Instructions (Signed)
Description   Continue on same dosage 1/2 tablet everyday except 1 tablet on Mondays and Fridays.  Call us with any changes or questions, bleeding issues-Coumadin Clinic # [phone], Main # [phone]. Recheck INR in 5 weeks.

## 2018-11-17 ENCOUNTER — Telehealth: Payer: Self-pay | Admitting: Nurse Practitioner

## 2018-11-17 ENCOUNTER — Telehealth: Payer: Self-pay

## 2018-11-17 NOTE — Telephone Encounter (Signed)

## 2018-11-17 NOTE — Telephone Encounter (Signed)
YOUR CARDIOLOGY TEAM HAS ARRANGED FOR AN E-VISIT FOR YOUR APPOINTMENT - PLEASE REVIEW IMPORTANT INFORMATION BELOW SEVERAL DAYS PRIOR TO YOUR APPOINTMENT  Due to the recent COVID-19 pandemic, we are transitioning in-person office visits to tele-medicine visits in an effort to decrease unnecessary exposure to our patients, their families, and staff. These visits are billed to your insurance just like a normal visit is. We also encourage you to sign up for MyChart if you have not already done so. You will need a smartphone if possible. For patients that do not have this, we can still complete the visit using a regular telephone but do prefer a smartphone to enable video when possible. You may have a family member that lives with you that can help. If possible, we also ask that you have a blood pressure cuff and scale at home to measure your blood pressure, heart rate and weight prior to your scheduled appointment. Patients with clinical needs that need an in-person evaluation and testing will still be able to come to the office if absolutely necessary. If you have any questions, feel free to call our office.   YOUR PROVIDER WILL BE USING THE FOLLOWING PLATFORM TO COMPLETE YOUR VISIT: Doxy.Me   . IF USING DOXIMITY or DOXY.ME - The staff will give you instructions on receiving your link to join the meeting the day of your visit.    2-3 DAYS BEFORE YOUR APPOINTMENT  You will receive a telephone call from one of our [facility] team members - your caller ID may say "Unknown caller." If this is a video visit, we will walk you through how to get the video launched on your phone. We will remind you check your blood pressure, heart rate and weight prior to your scheduled appointment. If you have an Apple Watch or Kardia, please upload any pertinent ECG strips the day before or morning of your appointment to MyChart. Our staff will also make sure you have reviewed the consent and agree to move forward with your  scheduled tele-health visit.    THE DAY OF YOUR APPOINTMENT  Approximately 15 minutes prior to your scheduled appointment, you will receive a telephone call from one of [facility] team - your caller ID may say "Unknown caller."  Our staff will confirm medications, vital signs for the day and any symptoms you may be experiencing. Please have this information available prior to the time of visit start. It may also be helpful for you to have a pad of paper and pen handy for any instructions given during your visit. They will also walk you through joining the smartphone meeting if this is a video visit.    CONSENT FOR TELE-HEALTH VISIT - PLEASE REVIEW  I hereby voluntarily request, consent and authorize [facility] and its employed or contracted physicians, physician assistants, nurse practitioners or other licensed health care professionals (the Practitioner), to provide me with telemedicine health care services (the "Services") as deemed necessary by the treating Practitioner. I acknowledge and consent to receive the Services by the Practitioner via telemedicine. I understand that the telemedicine visit will involve communicating with the Practitioner through live audiovisual communication technology and the disclosure of certain medical information by electronic transmission. I acknowledge that I have been given the opportunity to request an in-person assessment or other available alternative prior to the telemedicine visit and am voluntarily participating in the telemedicine visit.  I understand that I have the right to withhold or withdraw my consent to the use of telemedicine in   the course of my care at any time, without affecting my right to future care or treatment, and that the Practitioner or I may terminate the telemedicine visit at any time. I understand that I have the right to inspect all information obtained and/or recorded in the course of the telemedicine visit and may receive copies of  available information for a reasonable fee.  I understand that some of the potential risks of receiving the Services via telemedicine include:  . Delay or interruption in medical evaluation due to technological equipment failure or disruption; . Information transmitted may not be sufficient (e.g. poor resolution of images) to allow for appropriate medical decision making by the Practitioner; and/or  . In rare instances, security protocols could fail, causing a breach of personal health information.  Furthermore, I acknowledge that it is my responsibility to provide information about my medical history, conditions and care that is complete and accurate to the best of my ability. I acknowledge that Practitioner's advice, recommendations, and/or decision may be based on factors not within their control, such as incomplete or inaccurate data provided by me or distortions of diagnostic images or specimens that may result from electronic transmissions. I understand that the practice of medicine is not an exact science and that Practitioner makes no warranties or guarantees regarding treatment outcomes. I acknowledge that I will receive a copy of this consent concurrently upon execution via email to the email address I last provided but may also request a printed copy by calling the office of [facility].    I understand that my insurance will be billed for this visit.   I have read or had this consent read to me. . I understand the contents of this consent, which adequately explains the benefits and risks of the Services being provided via telemedicine.  . I have been provided ample opportunity to ask questions regarding this consent and the Services and have had my questions answered to my satisfaction. . I give my informed consent for the services to be provided through the use of telemedicine in my medical care  By participating in this telemedicine visit I agree to the above.  

## 2018-11-23 ENCOUNTER — Other Ambulatory Visit: Payer: Self-pay

## 2018-11-23 ENCOUNTER — Telehealth (INDEPENDENT_AMBULATORY_CARE_PROVIDER_SITE_OTHER): Payer: Medicare Other | Admitting: Cardiovascular Disease

## 2018-11-23 ENCOUNTER — Encounter: Payer: Self-pay | Admitting: Cardiovascular Disease

## 2018-11-23 VITALS — BP 120/79 | HR 83 | Ht 68.0 in | Wt 238.0 lb

## 2018-11-23 DIAGNOSIS — I480 Paroxysmal atrial fibrillation: Secondary | ICD-10-CM | POA: Diagnosis not present

## 2018-11-23 DIAGNOSIS — I2510 Atherosclerotic heart disease of native coronary artery without angina pectoris: Secondary | ICD-10-CM

## 2018-11-23 DIAGNOSIS — Z5181 Encounter for therapeutic drug level monitoring: Secondary | ICD-10-CM

## 2018-11-23 DIAGNOSIS — Z7189 Other specified counseling: Secondary | ICD-10-CM

## 2018-11-23 NOTE — Progress Notes (Signed)
Virtual Visit via Video Note   This visit type was conducted due to national recommendations for restrictions regarding the COVID-19 Pandemic (e.g. social distancing) in an effort to limit this patient's exposure and mitigate transmission in our community.  Due to his co-morbid illnesses, this patient is at least at moderate risk for complications without adequate follow up.  This format is felt to be most appropriate for this patient at this time.  All issues noted in this document were discussed and addressed.  A limited physical exam was performed with this format.  Please refer to the patient's chart for his consent to telehealth for [facility].   Date:  11/23/2018   ID:  Theodore Harrell, DOB January 16, 1946, MRN [id number]  Patient Location: Home Provider Location: Home  PCP:  Benito Mccreedy, MD  Cardiologist:  Sultana Tierney  Electrophysiologist:  None     Problem List 1. Chronic l atrial fib 2. ESRD - on HD  Tu, Th, Sat  3.  HTN - has resolved with dialysis  4.  CAD - s/p PCIs    Notes from Jan. 31, 2017:  Theodore Harrell is a 73 y.o. male who presents for further evaluation of paroxysmal atrial fib   Theodore Harrell has had end-stage renal disease for years. He is currently on the transplant list. As part of the transplant preoperative evaluation, he had a [place] study. Apparently he developed paroxysmal atrial fibrillation while getting the [employer]. He was seen by a cardiologist at [facility]. He was started on Coumadin and was referred back to his primary care doctor. His primary care doctor referred him to [place] for further management of his atrial fibrillation and Coumadin /INR levels.  Denies any chest pain or dyspnea.  Works out 3 days a week at the [employer] Performed:  Follow-Up Visit  Chief Complaint:  CAD, atrial fib   November 23, 2018    Theodore Harrell is a 73 y.o. male with hx of CAD;, s/p PCIs, chronic AF, ESRD.   He had a renal transplant.    I last saw him 3 years ago.   He has been seen at [facility] in the interim.  Has been doing well .   Has had some problems with gout.   Not exercising recently.  [facility] is closed.   Walks some outside. No cp or dyspnea.     The patient does not have symptoms concerning for COVID-19 infection (fever, chills, cough, or new shortness of breath).    Past Medical History:  Diagnosis Date  . Dependence on renal dialysis ([place])   . End stage renal disease ([place])   . HTN (hypertension)   . Hypertensive heart disease   . Hypogonadism in male   . Prediabetes    Past Surgical History:  Procedure Laterality Date  . KIDNEY TRANSPLANT       Current Meds  Medication Sig  . amLODipine (NORVASC) 5 MG tablet Take 5 mg by mouth daily.  . chlorthalidone (HYGROTON) 25 MG tablet Take 12.5 mg daily by mouth.   . clopidogrel (PLAVIX) 75 MG tablet Take 75 mg by mouth daily.  . furosemide (LASIX) 40 MG tablet Take 80 mg daily by mouth. Hold it weight is less than 185lbs   . magnesium oxide (MAG-OX) 400 (241.3 Mg) MG tablet Take 400 mg daily by mouth.   . metoprolol (LOPRESSOR) 100 MG tablet Take 100 mg by mouth 2 (two) times daily.  . mycophenolate (MYFORTIC) 180 MG EC  tablet Take 180 mg by mouth. Pt takes 1 tablet in the am and 1 tablet in the pm  . nitroGLYCERIN (NITROSTAT) 0.4 MG SL tablet Place 0.4 mg under the tongue every 5 (five) minutes as needed for chest pain.  . potassium chloride SA (K-DUR,KLOR-CON) 20 MEQ tablet Take 2 tablets each am and 1 tablet each pm  . predniSONE (DELTASONE) 5 MG tablet Take 5 mg daily with breakfast by mouth.  . rosuvastatin (CRESTOR) 20 MG tablet Take 20 mg by mouth daily.  . sildenafil (REVATIO) 20 MG tablet Take 100 mg by mouth as needed.   . tacrolimus (PROGRAF) 0.5 MG capsule Takes 2 tablets in the morning and 2 tablet in the evening  . warfarin (COUMADIN) 5 MG tablet Take as directed by Coumadin Clinic     Allergies:   Codeine and Penicillins   Social History    Tobacco Use  . Smoking status: Never Smoker  Substance Use Topics  . Alcohol use: No    Alcohol/week: 0.0 standard drinks  . Drug use: No     Family Hx: The patient's family history includes Hypertension in his father.  ROS:   Please see the history of present illness.     All other systems reviewed and are negative.   Prior CV studies:   The following studies were reviewed today:    Labs/Other Tests and Data Reviewed:    EKG:  No ECG reviewed.  Recent Labs: No results found for requested labs within last 8760 hours.   Recent Lipid Panel No results found for: CHOL, TRIG, HDL, CHOLHDL, LDLCALC, LDLDIRECT  Wt Readings from Last 3 Encounters:  11/23/18 238 lb (108 kg)  06/19/15 183 lb 12.8 oz (83.4 kg)     Objective:    Vital Signs:  BP 120/79 (BP Location: Right Arm, Patient Position: Sitting, Cuff Size: Normal)   Pulse 83   Ht 5\' 8"  (1.727 m)   Wt 238 lb (108 kg)   BMI 36.19 kg/m    VITAL SIGNS:  reviewed GEN:  no acute distress EYES:  sclerae anicteric, EOMI - Extraocular Movements Intact RESPIRATORY:  normal respiratory effort, symmetric expansion CARDIOVASCULAR:  no peripheral edema SKIN:  no rash, lesions or ulcers. MUSCULOSKELETAL:  no obvious deformities. NEURO:  alert and oriented x 3, no obvious focal deficit PSYCH:  normal affect  ASSESSMENT & PLAN:    1. CAD:   S/p PCIs.  No recent angina .  Needs to exercise more 2. ESRD- s/p renal transplant.  doingwell 3. Hyperlipidemia;  Continue crestor.   Check labs in 6 months at next office visit  4.   COVID-19 Education: The signs and symptoms of COVID-19 were discussed with the patient and how to seek care for testing (follow up with PCP or arrange E-visit).  The importance of social distancing was discussed today.  Time:   Today, I have spent  27  minutes with the patient with telehealth technology discussing the above problems.     Medication Adjustments/Labs and Tests Ordered: Current  medicines are reviewed at length with the patient today.  Concerns regarding medicines are outlined above.   Tests Ordered: Orders Placed This Encounter  Procedures  . Lipid Profile  . Basic Metabolic Panel (BMET)  . Hepatic function panel    Medication Changes: No orders of the defined types were placed in this encounter.   Follow Up:  Virtual Visit or In Person in 6 month(s)  Signed, Kristeen MissPhilip Tyr Franca, MD  11/23/2018 8:21  AM    [place]

## 2018-11-23 NOTE — Patient Instructions (Addendum)
Medication Instructions:  Your physician recommends that you continue on your current medications as directed. Please refer to the Current Medication list given to you today.  If you need a refill on your cardiac medications before your next appointment, please call your pharmacy.   Lab work: Your physician recommends that you return for lab work in: 6 months on the day of or a few days before your office visit with Dr. Acie Fredrickson.  You will need to FAST for this appointment - nothing to eat or drink after midnight the night before except water.   If you have labs (blood work) drawn today and your tests are completely normal, you will receive your results only by: Marland Kitchen MyChart Message (if you have MyChart) OR . A paper copy in the mail If you have any lab test that is abnormal or we need to change your treatment, we will call you to review the results.   Testing/Procedures: None Ordered   Follow-Up: At [facility], you and your health needs are our priority.  As part of our continuing mission to provide you with exceptional heart care, we have created designated Provider Care Teams.  These Care Teams include your primary Cardiologist (physician) and Advanced Practice Providers (APPs -  Physician Assistants and Nurse Practitioners) who all work together to provide you with the care you need, when you need it. You will need a follow up appointment in:  6 months.  Please call our office 2 months in advance to schedule this appointment.  You may see Dr. Acie Fredrickson or one of the following Advanced Practice Providers on your designated Care Team: Richardson Dopp, PA-C [place], [place] . Daune Perch, NP

## 2018-11-24 ENCOUNTER — Ambulatory Visit (INDEPENDENT_AMBULATORY_CARE_PROVIDER_SITE_OTHER): Payer: Medicare Other | Admitting: *Deleted

## 2018-11-24 ENCOUNTER — Other Ambulatory Visit: Payer: Self-pay

## 2018-11-24 DIAGNOSIS — Z5181 Encounter for therapeutic drug level monitoring: Secondary | ICD-10-CM

## 2018-11-24 DIAGNOSIS — I480 Paroxysmal atrial fibrillation: Secondary | ICD-10-CM

## 2018-11-24 LAB — POCT INR: INR: 2.2 (ref 2.0–3.0)

## 2018-11-24 NOTE — Patient Instructions (Signed)
Description   Continue on same dosage 1/2 tablet everyday except 1 tablet on Mondays and Fridays.  Call us with any changes or questions, bleeding issues-Coumadin Clinic # [phone], Main # [phone]. Recheck INR in 6 weeks.

## 2018-11-26 ENCOUNTER — Telehealth: Payer: Medicare Other | Admitting: Cardiovascular Disease

## 2019-01-05 ENCOUNTER — Ambulatory Visit (INDEPENDENT_AMBULATORY_CARE_PROVIDER_SITE_OTHER): Payer: Medicare Other | Admitting: *Deleted

## 2019-01-05 ENCOUNTER — Other Ambulatory Visit: Payer: Self-pay

## 2019-01-05 DIAGNOSIS — Z5181 Encounter for therapeutic drug level monitoring: Secondary | ICD-10-CM

## 2019-01-05 DIAGNOSIS — I480 Paroxysmal atrial fibrillation: Secondary | ICD-10-CM | POA: Diagnosis not present

## 2019-01-05 LAB — POCT INR: INR: 2.7 (ref 2.0–3.0)

## 2019-01-05 NOTE — Patient Instructions (Signed)
Description   Continue on same dosage 1/2 tablet everyday except 1 tablet on Mondays and Fridays.  Call us with any changes or questions, bleeding issues-Coumadin Clinic # [phone], Main # [phone]. Recheck INR in 8 weeks.

## 2019-03-02 ENCOUNTER — Ambulatory Visit (INDEPENDENT_AMBULATORY_CARE_PROVIDER_SITE_OTHER): Payer: Medicare Other | Admitting: Pharmacist

## 2019-03-02 ENCOUNTER — Other Ambulatory Visit: Payer: Self-pay

## 2019-03-02 DIAGNOSIS — I480 Paroxysmal atrial fibrillation: Secondary | ICD-10-CM | POA: Diagnosis not present

## 2019-03-02 LAB — POCT INR: INR: 3.2 — AB (ref 2.0–3.0)

## 2019-03-02 NOTE — Patient Instructions (Signed)
Description   Skip today's dose (0 tablets today) and then continue on same dosage 1/2 tablet everyday except 1 tablet on Mondays and Fridays.  Call us with any changes or questions, bleeding issues-Coumadin Clinic # [phone], Main # [phone]. Recheck INR in 3 weeks.

## 2019-03-10 ENCOUNTER — Other Ambulatory Visit: Payer: Self-pay | Admitting: Cardiovascular Disease

## 2019-03-10 MED ORDER — WARFARIN SODIUM 5 MG PO TABS: ORAL_TABLET | ORAL | 2 refills | Status: AC

## 2019-03-23 ENCOUNTER — Other Ambulatory Visit: Payer: Self-pay

## 2019-03-23 ENCOUNTER — Ambulatory Visit (INDEPENDENT_AMBULATORY_CARE_PROVIDER_SITE_OTHER): Payer: Medicare Other | Admitting: *Deleted

## 2019-03-23 DIAGNOSIS — I480 Paroxysmal atrial fibrillation: Secondary | ICD-10-CM

## 2019-03-23 LAB — POCT INR: INR: 3.5 — AB (ref 2.0–3.0)

## 2019-03-23 NOTE — Patient Instructions (Signed)
Description   Hold today then start taking 1/2 tablet everyday except 1 tablet on Fridays.  Call us with any changes or questions, bleeding issues-Coumadin Clinic # [phone], Main # [phone]. Recheck INR in 3 weeks.

## 2019-04-19 DEATH — deceased

## 2020-01-06 IMAGING — CT CT ABDOMEN AND PELVIS WITHOUT CONTRAST
1 of 2 series · 14 of 32 positions shown, 19 images · non-contrast
Comparison: None.

CLINICAL DATA: 72-year-old male with right side pain for several
months. History of kidney transplant in 9633.

EXAM:
CT ABDOMEN AND PELVIS WITHOUT CONTRAST
TECHNIQUE: Multidetector CT imaging of the abdomen and pelvis was performed
following the standard protocol without IV contrast.

[Series 2: renal standard/full · axial · 0.98mm/px · z∈[-558,-108]mm · 14 of 104 slices shown, 19 images]
[im 7/104  soft-tissue]
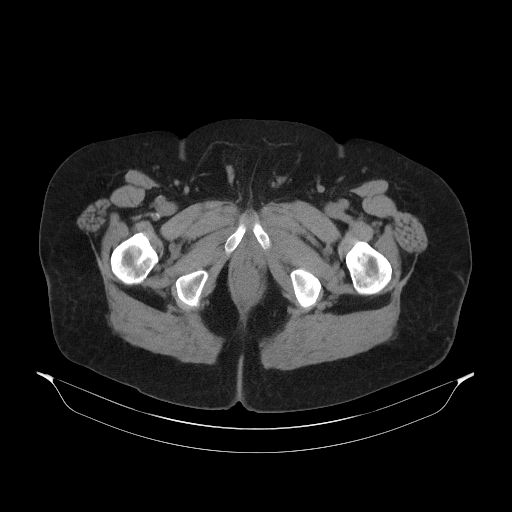
[im 7/104  bone]
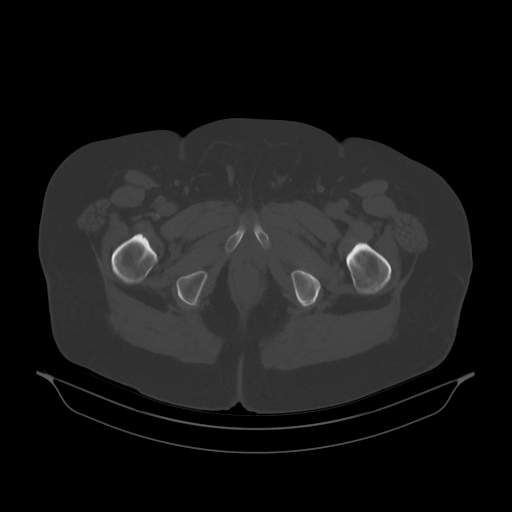
[im 13/104  soft-tissue]
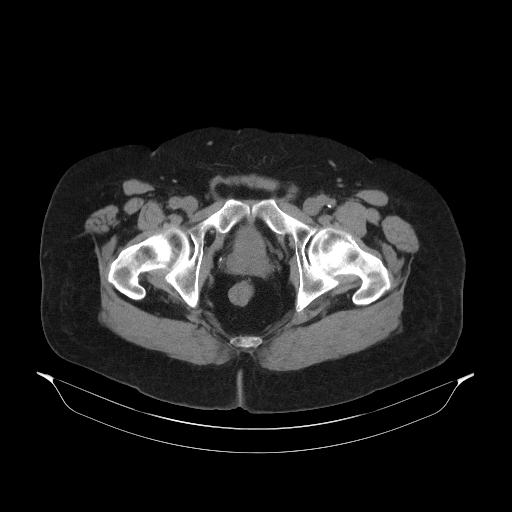
[im 20/104  soft-tissue]
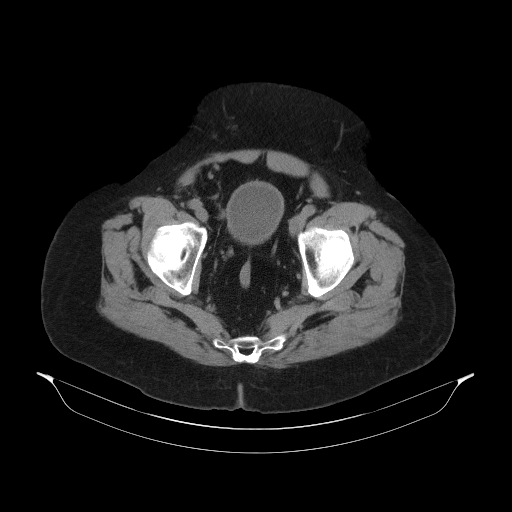
[im 33/104  soft-tissue]
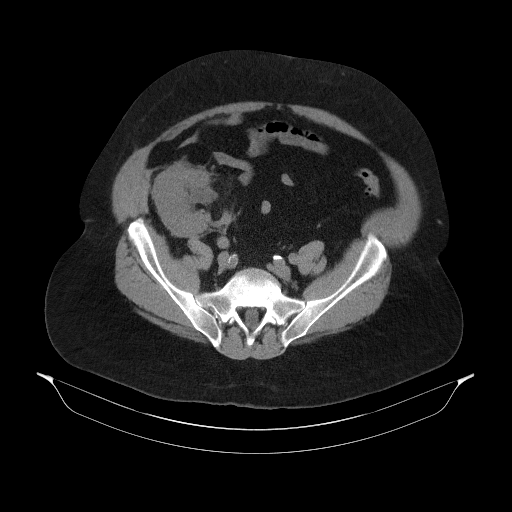
[im 39/104  soft-tissue]
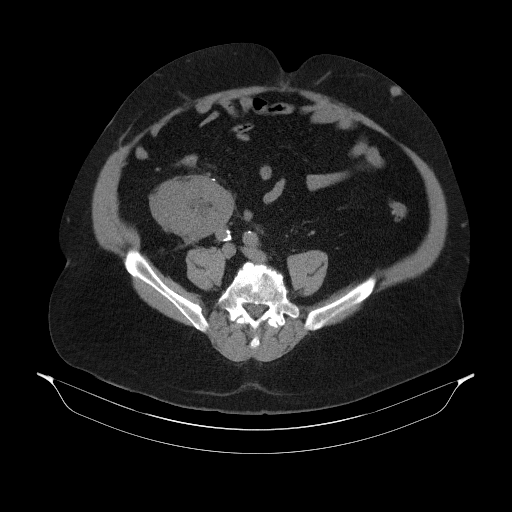
[im 46/104  soft-tissue]
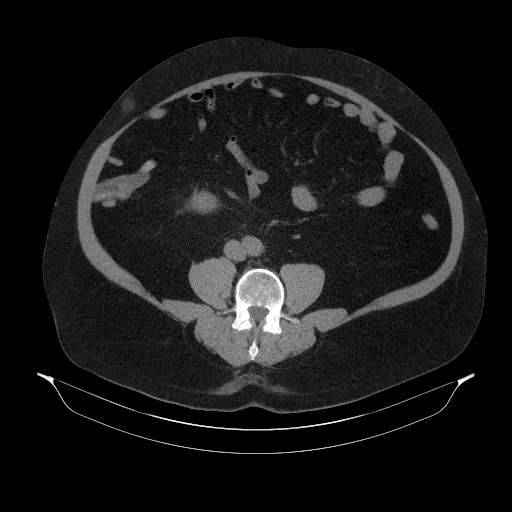
[im 52/104  soft-tissue]
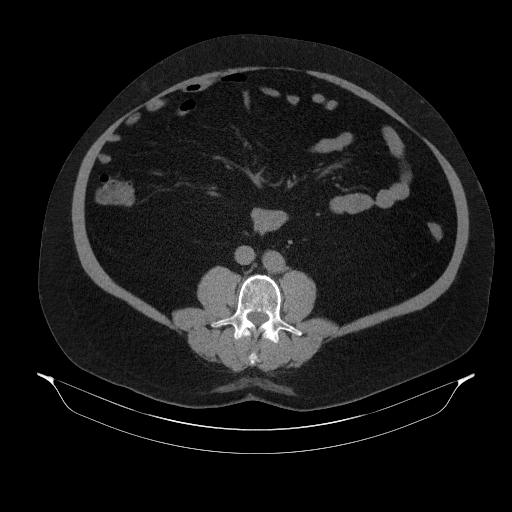
[im 58/104  soft-tissue]
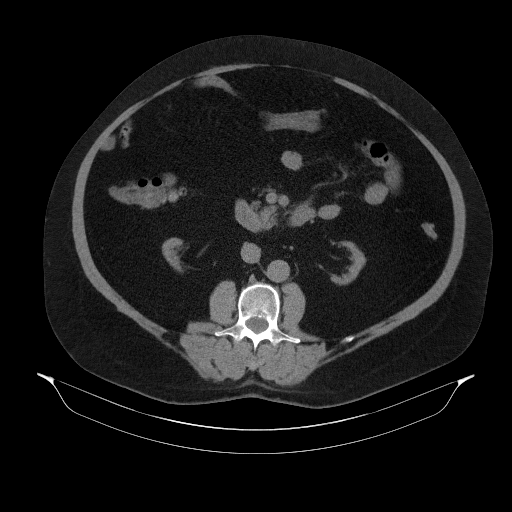
[im 65/104  soft-tissue]
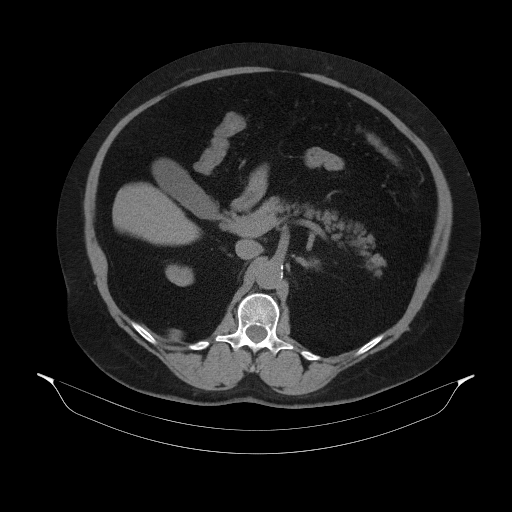
[im 65/104  bone]
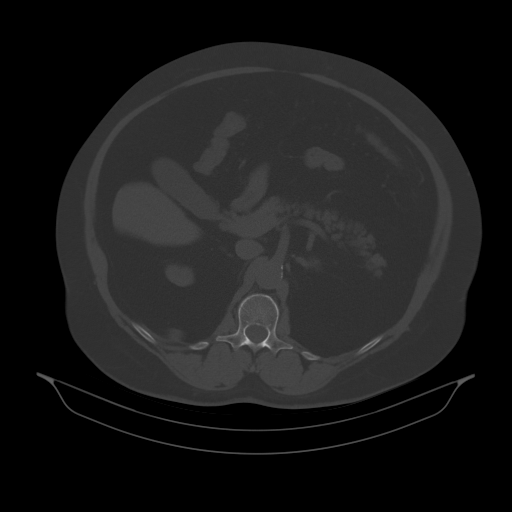
[im 71/104  soft-tissue]
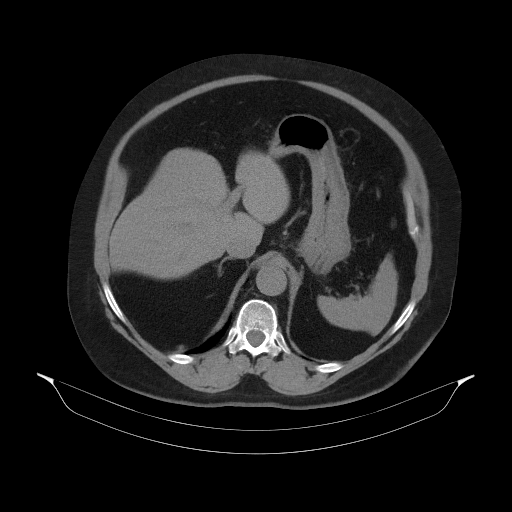
[im 78/104  lung]
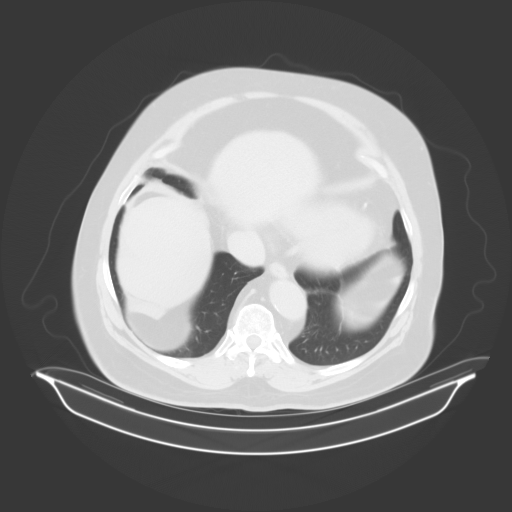
[im 84/104  soft-tissue]
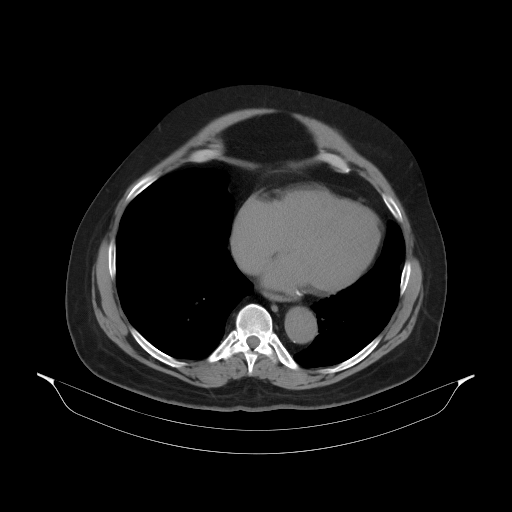
[im 84/104  lung]
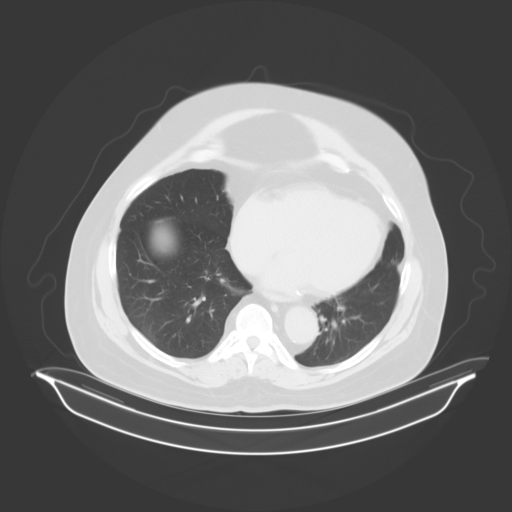
[im 91/104  soft-tissue]
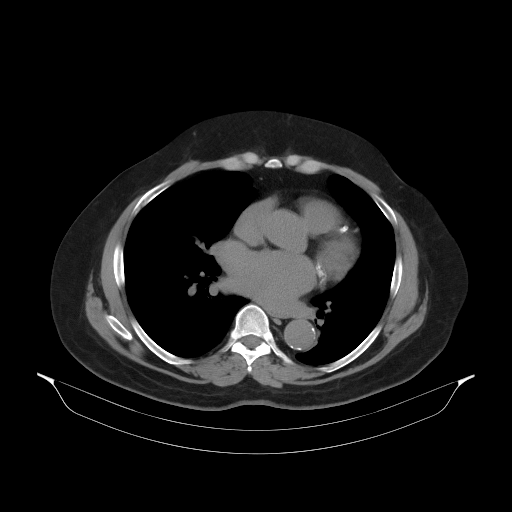
[im 91/104  lung]
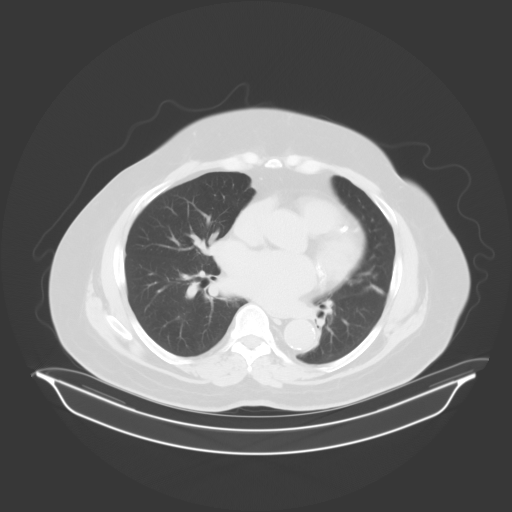
[im 97/104  soft-tissue]
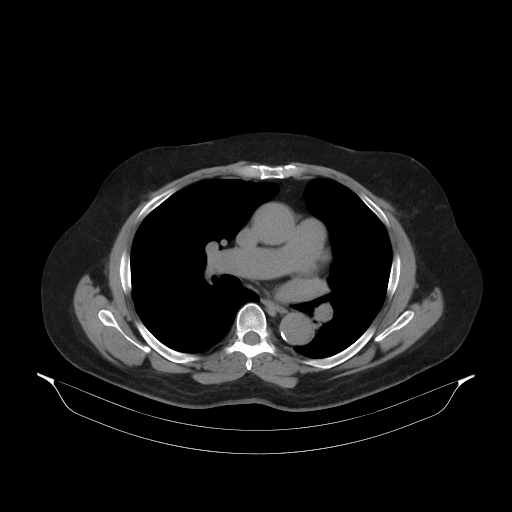
[im 97/104  lung]
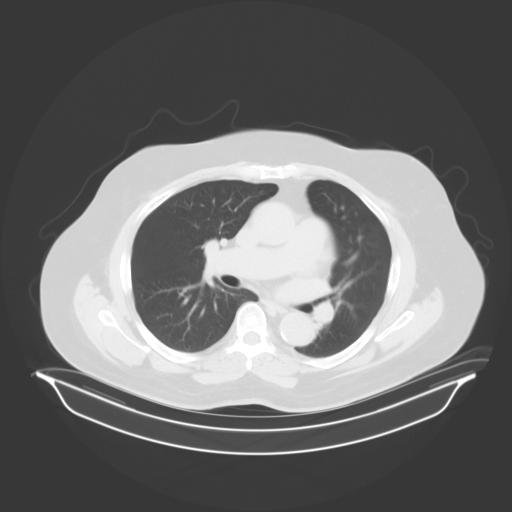

[14 of 32 positions shown; findings below may reference images not displayed]

FINDINGS: Lower chest: Cardiomegaly with calcified coronary artery
atherosclerosis and/or stents. No pericardial effusion. Tortuous
descending thoracic aorta. Intermittent mild linear scarring or
atelectasis in the lower lungs. No pleural effusion.

Hepatobiliary: Negative noncontrast liver and gallbladder.

Pancreas: Negative.

Spleen: Negative.

Adrenals/Urinary Tract: Normal adrenal glands. Bilateral native
renal atrophy.

Right lower quadrant renal transplant with no transplant
hydronephrosis or hydroureter (coronal image 59). Negative course of
the transplant ureter to the bladder (series 2, image 86).
Nonspecific transplant perinephric stranding is mild-to-moderate. No
urinary calculus identified.

Diminutive and unremarkable urinary bladder.

Stomach/Bowel: Decompressed large and small bowel loops throughout
the abdomen. Diverticulosis in the descending colon, proximal
sigmoid, and ascending colon near the hepatic flexure. No active
inflammation. Normal appendix. Decompressed stomach. No free air,
free fluid.

Vascular/Lymphatic: Aortoiliac calcified atherosclerosis. Vascular
patency is not evaluated in the absence of IV contrast.

No lymphadenopathy.

Reproductive: Negative.

Other: No pelvic free fluid.

Musculoskeletal: Advanced lower lumbar disc degeneration with vacuum
disc. Mild grade 1 anterolisthesis at that level. No acute osseous
abnormality identified.
IMPRESSION: 1. Nonobstructed right lower quadrant renal transplant and no
urinary calculi identified. But nonspecific mild-to-moderate
transplant perinephric stranding.
2. No other acute or inflammatory process identified in the
non-contrast abdomen and pelvis.
3. Native renal atrophy. Large bowel diverticulosis. Mild
cardiomegaly and ectasia of the aorta. Advanced L4-L5 disc
degeneration.
